# Patient Record
Sex: Female | Born: 1960 | Race: Black or African American | Hispanic: No | Smoking: Former smoker
Health system: Southern US, Community
[De-identification: ages and names within clinical notes are randomized; demographics above are authoritative.]

## PROBLEM LIST (undated history)

## (undated) DIAGNOSIS — I10 Essential (primary) hypertension: Secondary | ICD-10-CM

## (undated) DIAGNOSIS — I509 Heart failure, unspecified: Secondary | ICD-10-CM

## (undated) DIAGNOSIS — E785 Hyperlipidemia, unspecified: Secondary | ICD-10-CM

## (undated) DIAGNOSIS — E119 Type 2 diabetes mellitus without complications: Secondary | ICD-10-CM

---

## 2008-11-05 ENCOUNTER — Emergency Department (HOSPITAL_BASED_OUTPATIENT_CLINIC_OR_DEPARTMENT_OTHER): Admission: EM | Admit: 2008-11-05 | Discharge: 2008-11-05 | Payer: Self-pay | Admitting: Emergency Medicine

## 2008-11-05 ENCOUNTER — Ambulatory Visit: Payer: Self-pay | Admitting: Interventional Radiology

## 2010-04-17 ENCOUNTER — Ambulatory Visit: Payer: Self-pay | Admitting: Diagnostic Radiology

## 2010-04-17 ENCOUNTER — Ambulatory Visit (HOSPITAL_BASED_OUTPATIENT_CLINIC_OR_DEPARTMENT_OTHER)
Admission: RE | Admit: 2010-04-17 | Discharge: 2010-04-17 | Payer: Self-pay | Source: Home / Self Care | Admitting: Obstetrics and Gynecology

## 2010-08-26 LAB — CBC
HCT: 46.9 % — ABNORMAL HIGH (ref 36.0–46.0)
MCV: 91.5 fL (ref 78.0–100.0)
Platelets: 275 10*3/uL (ref 150–400)
RDW: 12.6 % (ref 11.5–15.5)
WBC: 6.4 10*3/uL (ref 4.0–10.5)

## 2010-08-26 LAB — BASIC METABOLIC PANEL
BUN: 9 mg/dL (ref 6–23)
Chloride: 102 mEq/L (ref 96–112)
Creatinine, Ser: 0.6 mg/dL (ref 0.4–1.2)
Glucose, Bld: 202 mg/dL — ABNORMAL HIGH (ref 70–99)
Potassium: 4.2 mEq/L (ref 3.5–5.1)

## 2010-08-26 LAB — DIFFERENTIAL
Basophils Absolute: 0.1 10*3/uL (ref 0.0–0.1)
Basophils Relative: 1 % (ref 0–1)
Eosinophils Absolute: 0.2 10*3/uL (ref 0.0–0.7)
Eosinophils Relative: 2 % (ref 0–5)
Lymphs Abs: 2.6 10*3/uL (ref 0.7–4.0)
Neutrophils Relative %: 48 % (ref 43–77)

## 2010-08-26 LAB — POCT CARDIAC MARKERS: Myoglobin, poc: 73 ng/mL (ref 12–200)

## 2015-12-21 HISTORY — PX: AORTA - FEMORAL ARTERY BYPASS GRAFT: SUR173

## 2016-08-04 ENCOUNTER — Emergency Department (HOSPITAL_BASED_OUTPATIENT_CLINIC_OR_DEPARTMENT_OTHER)
Admission: EM | Admit: 2016-08-04 | Discharge: 2016-08-05 | Disposition: A | Discharge status: Home / Self Care | Payer: PRIVATE HEALTH INSURANCE | Attending: Emergency Medicine | Admitting: Emergency Medicine

## 2016-08-04 ENCOUNTER — Emergency Department (HOSPITAL_BASED_OUTPATIENT_CLINIC_OR_DEPARTMENT_OTHER): Payer: PRIVATE HEALTH INSURANCE

## 2016-08-04 ENCOUNTER — Encounter (HOSPITAL_BASED_OUTPATIENT_CLINIC_OR_DEPARTMENT_OTHER): Payer: Self-pay | Admitting: *Deleted

## 2016-08-04 DIAGNOSIS — F17200 Nicotine dependence, unspecified, uncomplicated: Secondary | ICD-10-CM | POA: Diagnosis not present

## 2016-08-04 DIAGNOSIS — E119 Type 2 diabetes mellitus without complications: Secondary | ICD-10-CM | POA: Insufficient documentation

## 2016-08-04 DIAGNOSIS — I10 Essential (primary) hypertension: Secondary | ICD-10-CM | POA: Insufficient documentation

## 2016-08-04 DIAGNOSIS — H538 Other visual disturbances: Secondary | ICD-10-CM | POA: Diagnosis present

## 2016-08-04 HISTORY — DX: Hyperlipidemia, unspecified: E78.5

## 2016-08-04 HISTORY — DX: Type 2 diabetes mellitus without complications: E11.9

## 2016-08-04 HISTORY — DX: Heart failure, unspecified: I50.9

## 2016-08-04 HISTORY — DX: Essential (primary) hypertension: I10

## 2016-08-04 LAB — COMPREHENSIVE METABOLIC PANEL
ALT: 27 U/L (ref 14–54)
ANION GAP: 9 (ref 5–15)
AST: 27 U/L (ref 15–41)
Albumin: 4.5 g/dL (ref 3.5–5.0)
Alkaline Phosphatase: 72 U/L (ref 38–126)
BUN: 13 mg/dL (ref 6–20)
CHLORIDE: 98 mmol/L — AB (ref 101–111)
CO2: 31 mmol/L (ref 22–32)
CREATININE: 0.63 mg/dL (ref 0.44–1.00)
Calcium: 9.7 mg/dL (ref 8.9–10.3)
Glucose, Bld: 143 mg/dL — ABNORMAL HIGH (ref 65–99)
POTASSIUM: 4.3 mmol/L (ref 3.5–5.1)
Sodium: 138 mmol/L (ref 135–145)
Total Bilirubin: 0.5 mg/dL (ref 0.3–1.2)
Total Protein: 8.1 g/dL (ref 6.5–8.1)

## 2016-08-04 LAB — CBC WITH DIFFERENTIAL/PLATELET
Basophils Absolute: 0 10*3/uL (ref 0.0–0.1)
Basophils Relative: 1 %
EOS ABS: 0.1 10*3/uL (ref 0.0–0.7)
EOS PCT: 2 %
HCT: 54.9 % — ABNORMAL HIGH (ref 36.0–46.0)
Hemoglobin: 17.9 g/dL — ABNORMAL HIGH (ref 12.0–15.0)
LYMPHS ABS: 2.2 10*3/uL (ref 0.7–4.0)
LYMPHS PCT: 49 %
MCH: 30.1 pg (ref 26.0–34.0)
MCHC: 32.6 g/dL (ref 30.0–36.0)
MCV: 92.3 fL (ref 78.0–100.0)
MONO ABS: 0.4 10*3/uL (ref 0.1–1.0)
MONOS PCT: 9 %
Neutro Abs: 1.8 10*3/uL (ref 1.7–7.7)
Neutrophils Relative %: 39 %
PLATELETS: 221 10*3/uL (ref 150–400)
RBC: 5.95 MIL/uL — ABNORMAL HIGH (ref 3.87–5.11)
RDW: 12.3 % (ref 11.5–15.5)
WBC: 4.5 10*3/uL (ref 4.0–10.5)

## 2016-08-04 LAB — CBG MONITORING, ED: GLUCOSE-CAPILLARY: 160 mg/dL — AB (ref 65–99)

## 2016-08-04 LAB — BRAIN NATRIURETIC PEPTIDE: B NATRIURETIC PEPTIDE 5: 37.3 pg/mL (ref 0.0–100.0)

## 2016-08-04 NOTE — ED Triage Notes (Signed)
Here for "felt bad, felt woozy", also anxious, "onset at ~ 1900 while sitting, and preparing for an interview" (denies: HA, dizziness, visual changes, nausea, sob, or CP), "thought it was her BP or BS". Took lisinopril 5mg  at 1930, last dose prior to that was 3 weeks ago. Takes PRN. Has a PCP and card MD. Alert, NAD, calm, interactive, resps e/u, speaking in clear complete sentences, no dyspnea noted, skin W&D. [facility]

## 2016-08-04 NOTE — ED Notes (Signed)
Patient is on the phone with an radio interview. Patient on hold for CT at this time and MD aware

## 2016-08-04 NOTE — ED Notes (Signed)
ED Provider at bedside. 

## 2016-08-04 NOTE — ED Provider Notes (Signed)
[facility] Provider [facility]   CSN: [id number] Arrival date & time: 08/04/16  1952   By signing my name below, I, Kaitlyn Weber, attest that this documentation has been prepared under the direction and in the presence of Kaitlyn FossaElizabeth Ahkeem Goede, MD . Electronically Signed: Freida Busmaniana Weber, Scribe. 08/04/2016. 9:31 PM.   History   Chief Complaint Chief Complaint  Patient presents with  . Hypertension    The history is provided by the patient. No language interpreter was used.     HPI Comments:  Kaitlyn Weber is a 56 y.o. female with a history of heart failure, DM, HLD, and HTN, who presents to the Emergency Department complaining of elevated BP today. She was preparing for a radio interview when she began to feel woozy and off balance. She checked her BP and it was 199/126 around 1830. She checked it again and it was 229/136. She then took a lisinopril. She states she usually takes the lisinopril when her BP is elevated and not as prescribed. Pt states she feels better at this time. She has no other acute complaints or symptoms at this time.    Past Medical History:  Diagnosis Date  . Diabetes mellitus without complication (HCC)   . Heart failure (HCC)   . Hyperlipidemia   . Hypertension     There are no active problems to display for this patient.   Past Surgical History:  Procedure Laterality Date  . AORTA - FEMORAL ARTERY BYPASS GRAFT [facility]21/2015   [facility] [place] [place]  . CESAREAN SECTION      OB History    No data available       Home Medications    Prior to Admission medications   Not on File    Family History History reviewed. No pertinent family history.  Social History Social History  Substance Use Topics  . Smoking status: Current Some Day Smoker  . Smokeless tobacco: Never Used  . Alcohol use No     Allergies   Sulfa antibiotics   Review of Systems Review of Systems  10 systems reviewed and all are negative for acute change except  as noted in the HPI.   Physical Exam Updated Vital Signs BP (!) 120/92   Pulse 99   Temp 98.1 F (36.7 C) (Oral)   Resp 12   Ht 5' 4.5" (1.638 m)   Wt 140 lb (63.5 kg)   SpO2 98%   BMI 23.66 kg/m   Physical Exam  Constitutional: She is oriented to person, place, and time. She appears well-developed and well-nourished.  HENT:  Head: Normocephalic and atraumatic.  Neck: Neck supple.  Cardiovascular: Normal rate and regular rhythm.   No murmur heard. Pulmonary/Chest: Effort normal and breath sounds normal. No respiratory distress.  Abdominal: Soft. There is no tenderness. There is no rebound and no guarding.  Musculoskeletal: She exhibits no edema or tenderness.  Neurological: She is alert and oriented to person, place, and time.  No facial asymmetry.  5/5 strength in all four extremities.   Skin: Skin is warm and dry.  Psychiatric: She has a normal mood and affect. Her behavior is normal.  Nursing note and vitals reviewed.    ED Treatments / Results  DIAGNOSTIC STUDIES:  Oxygen Saturation is 97% on RA, normal by my interpretation.    COORDINATION OF CARE:  9:30 PM Discussed treatment plan with pt at bedside and pt agreed to plan.  Labs (all labs ordered are listed, but only abnormal results are  displayed) Labs Reviewed  CBC WITH DIFFERENTIAL/PLATELET - Abnormal; Notable for the following:       Result Value   RBC 5.95 (*)    Hemoglobin 17.9 (*)    HCT 54.9 (*)    All other components within normal limits  COMPREHENSIVE METABOLIC PANEL - Abnormal; Notable for the following:    Chloride 98 (*)    Glucose, Bld 143 (*)    All other components within normal limits  CBG MONITORING, ED - Abnormal; Notable for the following:    Glucose-Capillary 160 (*)    All other components within normal limits  BRAIN NATRIURETIC PEPTIDE    EKG  EKG Interpretation  Date/Time:  Monday August 04 2016 20:18:13 EDT Ventricular Rate:  99 PR Interval:  114 QRS Duration: 84 QT  Interval:  360 QTC Calculation: 462 R Axis:   145 Text Interpretation:   Suspect arm lead reversal, interpretation assumes no reversal Normal sinus rhythm Right axis deviation Pulmonary disease pattern Abnormal ECG Confirmed by Lincoln Brigham [phone]) on 08/04/2016 8:34:22 PM       Radiology Dg Chest 2 View  Result Date: 08/04/2016 CLINICAL DATA:  Malignant hypertension EXAM: CHEST  2 VIEW COMPARISON:  11/05/2008 FINDINGS: Streaky atelectasis at the left lung base. No pleural effusion. Cardiomediastinal silhouette within normal limits. No pneumothorax. IMPRESSION: Streaky left basilar atelectasis. Electronically Signed   By: Kaitlyn Weber M.D.   On: 08/04/2016 21:15   Ct Head Wo Contrast  Result Date: 08/04/2016 CLINICAL DATA:  Acute onset of blurred vision.  Initial encounter. EXAM: CT HEAD WITHOUT CONTRAST TECHNIQUE: Contiguous axial images were obtained from the base of the skull through the vertex without intravenous contrast. COMPARISON:  None. FINDINGS: Brain: No evidence of acute infarction, hemorrhage, hydrocephalus, extra-axial collection or mass lesion/mass effect. The posterior fossa, including the cerebellum, brainstem and fourth ventricle, is within normal limits. The third and lateral ventricles, and basal ganglia are unremarkable in appearance. The cerebral hemispheres are symmetric in appearance, with normal gray-white differentiation. No mass effect or midline shift is seen. Vascular: No hyperdense vessel or unexpected calcification. Skull: There is no evidence of fracture; visualized osseous structures are unremarkable in appearance. Sinuses/Orbits: The visualized portions of the orbits are within normal limits. The paranasal sinuses and mastoid air cells are well-aerated. Other: No significant soft tissue abnormalities are seen. IMPRESSION: Unremarkable noncontrast CT of the head. Electronically Signed   By: Roanna Raider M.D.   On: 08/04/2016 23:50    Procedures Procedures (including  critical care time)  Medications Ordered in ED Medications - No data to display   Initial Impression / Assessment and Plan / ED Course  I have reviewed the triage vital signs and the nursing notes.  Pertinent labs & imaging results that were available during my care of the patient were reviewed by me and considered in my medical decision making (see chart for details).     Patient with history of hypertension and vascular disease here for evaluation of a woozy sensation with elevated blood pressure prior to ED arrival. Patient is hypertensive on ED arrival but this sensation of wooziness has resolved. She hasn't noticed acute distress with a nonfocal neurologic examination. Presentation is not consistent with CVA, hypertensive emergency, ACS, dissection. On repeat evaluation she continues to be asymptomatic and her blood pressure is 120/92. Counseled patient on home care for hypertension as well as taking her lisinopril daily with close PCP follow-up and return precautions.  Final Clinical Impressions(s) / ED Diagnoses  Final diagnoses:  Essential hypertension    New Prescriptions New Prescriptions   No medications on file   I personally performed the services described in this documentation, which was scribed in my presence. The recorded information has been reviewed and is accurate.     Kaitlyn Fossa, MD 08/05/16 0010

## 2017-07-05 ENCOUNTER — Emergency Department (HOSPITAL_BASED_OUTPATIENT_CLINIC_OR_DEPARTMENT_OTHER): Payer: Medicare Other

## 2017-07-05 ENCOUNTER — Emergency Department (HOSPITAL_BASED_OUTPATIENT_CLINIC_OR_DEPARTMENT_OTHER)
Admission: EM | Admit: 2017-07-05 | Discharge: 2017-07-05 | Disposition: A | Discharge status: Home / Self Care | Payer: Medicare Other | Attending: Emergency Medicine | Admitting: Emergency Medicine

## 2017-07-05 ENCOUNTER — Other Ambulatory Visit: Payer: Self-pay

## 2017-07-05 ENCOUNTER — Encounter (HOSPITAL_BASED_OUTPATIENT_CLINIC_OR_DEPARTMENT_OTHER): Payer: Self-pay | Admitting: Emergency Medicine

## 2017-07-05 DIAGNOSIS — I10 Essential (primary) hypertension: Secondary | ICD-10-CM | POA: Diagnosis not present

## 2017-07-05 DIAGNOSIS — J441 Chronic obstructive pulmonary disease with (acute) exacerbation: Secondary | ICD-10-CM | POA: Insufficient documentation

## 2017-07-05 DIAGNOSIS — Z79899 Other long term (current) drug therapy: Secondary | ICD-10-CM | POA: Diagnosis not present

## 2017-07-05 DIAGNOSIS — E119 Type 2 diabetes mellitus without complications: Secondary | ICD-10-CM | POA: Diagnosis not present

## 2017-07-05 DIAGNOSIS — F17200 Nicotine dependence, unspecified, uncomplicated: Secondary | ICD-10-CM | POA: Diagnosis not present

## 2017-07-05 DIAGNOSIS — Z7984 Long term (current) use of oral hypoglycemic drugs: Secondary | ICD-10-CM | POA: Insufficient documentation

## 2017-07-05 DIAGNOSIS — R0602 Shortness of breath: Secondary | ICD-10-CM | POA: Diagnosis present

## 2017-07-05 LAB — I-STAT CG4 LACTIC ACID, ED: Lactic Acid, Venous: 1.88 mmol/L (ref 0.5–1.9)

## 2017-07-05 LAB — COMPREHENSIVE METABOLIC PANEL
ALK PHOS: 47 U/L (ref 38–126)
ALT: 22 U/L (ref 14–54)
ANION GAP: 5 (ref 5–15)
AST: 26 U/L (ref 15–41)
Albumin: 3.6 g/dL (ref 3.5–5.0)
BUN: 12 mg/dL (ref 6–20)
CALCIUM: 8.6 mg/dL — AB (ref 8.9–10.3)
CO2: 32 mmol/L (ref 22–32)
Chloride: 97 mmol/L — ABNORMAL LOW (ref 101–111)
Creatinine, Ser: 0.67 mg/dL (ref 0.44–1.00)
GFR calc non Af Amer: >60 mL/min (ref >60)
Glucose, Bld: 212 mg/dL — ABNORMAL HIGH (ref 65–99)
POTASSIUM: 4.5 mmol/L (ref 3.5–5.1)
Sodium: 134 mmol/L — ABNORMAL LOW (ref 135–145)
TOTAL PROTEIN: 6.8 g/dL (ref 6.5–8.1)
Total Bilirubin: 0.4 mg/dL (ref 0.3–1.2)

## 2017-07-05 LAB — CBC WITH DIFFERENTIAL/PLATELET
BASOS ABS: 0 10*3/uL (ref 0.0–0.1)
Basophils Relative: 1 %
Eosinophils Absolute: 0 10*3/uL (ref 0.0–0.7)
Eosinophils Relative: 1 %
HEMATOCRIT: 52.3 % — AB (ref 36.0–46.0)
HEMOGLOBIN: 16.5 g/dL — AB (ref 12.0–15.0)
LYMPHS PCT: 23 %
Lymphs Abs: 1.4 10*3/uL (ref 0.7–4.0)
MCH: 30.3 pg (ref 26.0–34.0)
MCHC: 31.5 g/dL (ref 30.0–36.0)
MCV: 96.1 fL (ref 78.0–100.0)
MONO ABS: 0.8 10*3/uL (ref 0.1–1.0)
MONOS PCT: 14 %
NEUTROS ABS: 3.7 10*3/uL (ref 1.7–7.7)
NEUTROS PCT: 63 %
Platelets: 191 10*3/uL (ref 150–400)
RBC: 5.44 MIL/uL — ABNORMAL HIGH (ref 3.87–5.11)
RDW: 12.9 % (ref 11.5–15.5)
WBC: 6 10*3/uL (ref 4.0–10.5)

## 2017-07-05 LAB — INFLUENZA PANEL BY PCR (TYPE A & B)
INFLAPCR: NEGATIVE
INFLBPCR: NEGATIVE

## 2017-07-05 LAB — TROPONIN I: Troponin I: <0.03 ng/mL (ref <0.03)

## 2017-07-05 MED ORDER — AZITHROMYCIN 250 MG PO TABS: 250.0000 mg | ORAL_TABLET | Freq: Every day | ORAL | 0 refills | Status: AC

## 2017-07-05 MED ORDER — ALBUTEROL SULFATE (2.5 MG/3ML) 0.083% IN NEBU
5.0000 mg | INHALATION_SOLUTION | Freq: Once | RESPIRATORY_TRACT | Status: AC
  Administered 2017-07-05: 5 mg via RESPIRATORY_TRACT
  Filled 2017-07-05: qty 6

## 2017-07-05 MED ORDER — PREDNISONE 20 MG PO TABS: ORAL_TABLET | ORAL | 0 refills | Status: DC

## 2017-07-05 MED ORDER — ALBUTEROL SULFATE HFA 108 (90 BASE) MCG/ACT IN AERS
2.0000 | INHALATION_SPRAY | Freq: Once | RESPIRATORY_TRACT | Status: AC
  Administered 2017-07-05: 2 via RESPIRATORY_TRACT
  Filled 2017-07-05: qty 6.7

## 2017-07-05 MED ORDER — PREDNISONE 50 MG PO TABS
60.0000 mg | ORAL_TABLET | Freq: Once | ORAL | Status: AC
  Administered 2017-07-05: 18:00:00 60 mg via ORAL
  Filled 2017-07-05: qty 1

## 2017-07-05 MED ORDER — ALBUTEROL SULFATE (2.5 MG/3ML) 0.083% IN NEBU
5.0000 mg | INHALATION_SOLUTION | Freq: Once | RESPIRATORY_TRACT | Status: AC
  Administered 2017-07-05: 2.5 mg via RESPIRATORY_TRACT
  Filled 2017-07-05: qty 6

## 2017-07-05 MED ORDER — IPRATROPIUM-ALBUTEROL 0.5-2.5 (3) MG/3ML IN SOLN
3.0000 mL | Freq: Four times a day (QID) | RESPIRATORY_TRACT | Status: DC
  Administered 2017-07-05: 3 mL via RESPIRATORY_TRACT

## 2017-07-05 MED ORDER — IPRATROPIUM BROMIDE 0.02 % IN SOLN
0.5000 mg | Freq: Once | RESPIRATORY_TRACT | Status: DC
  Filled 2017-07-05: qty 2.5

## 2017-07-05 MED ORDER — ACETAMINOPHEN 325 MG PO TABS
650.0000 mg | ORAL_TABLET | Freq: Once | ORAL | Status: AC
  Administered 2017-07-05: 650 mg via ORAL
  Filled 2017-07-05: qty 2

## 2017-07-05 NOTE — ED Notes (Signed)
Patient was showing 88-90 percent oxygen while ambulating. Walked patient back to the room and put back on oxygen. Nasal Cannula. 2LPM

## 2017-07-05 NOTE — ED Provider Notes (Signed)
[facility] EMERGENCY DEPARTMENT Provider Note   CSN: [id number] Arrival date & time: 07/05/17  1409     History   Chief Complaint Chief Complaint  Patient presents with  . Cough    HPI Kaitlyn Weber is a 57 y.o. female.  HPI  57 year old female with a history of diabetes, hypertension, CHF and chronic bronchitis presents with shortness of breath.  She states she has been feeling ill for about 3 days.  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  She has had low-grade fevers up to around 99.  She has been coughing with a mixture of white and yellow sputum.  She has had nasal congestion, chest congestion, and shortness of breath.  She denies any leg swelling.  Has had some headache and myalgias.  Some chest pain with coughing but not all the time.  Past Medical History:  Diagnosis Date  . Diabetes mellitus without complication (HCC)   . Heart failure (HCC)   . Hyperlipidemia   . Hypertension     There are no active problems to display for this patient.   Past Surgical History:  Procedure Laterality Date  . AORTA - FEMORAL ARTERY BYPASS GRAFT Bilateral 12/21/2015   at [facility], [place]  . CESAREAN SECTION      OB History    No data available       Home Medications    Prior to Admission medications   Medication Sig Start Date End Date Taking? Authorizing Provider  furosemide (LASIX) 20 MG tablet Take 20 mg by mouth.   Yes [provider]  lisinopril (PRINIVIL,ZESTRIL) 20 MG tablet Take 20 mg by mouth daily.   Yes [provider]  azithromycin (ZITHROMAX) 250 MG tablet Take 1 tablet (250 mg total) by mouth daily. Take first 2 tablets together, then 1 every day until finished. 07/05/17   Pricilla Loveless, MD  metFORMIN (GLUCOPHAGE) 500 MG tablet Take by mouth 2 (two) times daily with a meal.    [provider]  predniSONE (DELTASONE) 20 MG tablet 2  tabs po daily x 4 days 07/06/17   Pricilla Loveless, MD    Family History History reviewed. No pertinent family history.  Social History Social History   Tobacco Use  . Smoking status: Current Some Day Smoker  . Smokeless tobacco: Never Used  Substance Use Topics  . Alcohol use: No  . Drug use: No     Allergies   Sulfa antibiotics   Review of Systems Review of Systems  Constitutional: Positive for fever.  HENT: Positive for congestion.   Respiratory: Positive for cough and shortness of breath.   Cardiovascular: Positive for chest pain. Negative for leg swelling.  Gastrointestinal: Negative for vomiting.  Musculoskeletal: Positive for myalgias.  Neurological: Positive for headaches.  All other systems reviewed and are negative.    Physical Exam Updated Vital Signs BP 121/73   Pulse 88   Temp (!) 100.8 F (38.2 C) (Oral)   Resp 19   Ht 5\' 5"  (1.651 m)   Wt 68 kg (150 lb)   SpO2 90% Comment: While walking. Patient did not seem to be in distress at this time.  BMI 24.96 kg/m   Physical Exam  Constitutional: She is oriented to person, place, and time. She appears well-developed and well-nourished. No distress.  HENT:  Head: Normocephalic and atraumatic.  Right Ear: External ear normal.  Left Ear: External ear normal.  Nose: Nose normal.  Eyes: Right eye exhibits no discharge. Left eye exhibits no discharge.  Cardiovascular: Normal rate, regular rhythm and normal heart sounds.  Pulmonary/Chest: Effort normal. No respiratory distress. She has wheezes (diffuse expiratory).  Abdominal: Soft. There is no tenderness.  Neurological: She is alert and oriented to person, place, and time.  Skin: Skin is warm and dry. She is not diaphoretic.  Nursing note and vitals reviewed.    ED Treatments / Results  Labs (all labs ordered are listed, but only abnormal results are displayed) Labs Reviewed  COMPREHENSIVE METABOLIC PANEL - Abnormal; Notable for the following  components:      Result Value   Sodium 134 (*)    Chloride 97 (*)    Glucose, Bld 212 (*)    Calcium 8.6 (*)    All other components within normal limits  CBC WITH DIFFERENTIAL/PLATELET - Abnormal; Notable for the following components:   RBC 5.44 (*)    Hemoglobin 16.5 (*)    HCT 52.3 (*)    All other components within normal limits  CULTURE, BLOOD (ROUTINE X 2)  CULTURE, BLOOD (ROUTINE X 2)  TROPONIN I  INFLUENZA PANEL BY PCR (TYPE A & B)  I-STAT CG4 LACTIC ACID, ED    EKG  EKG Interpretation  Date/Time:  Sunday July 05 2017 15:02:43 EST Ventricular Rate:  96 PR Interval:  136 QRS Duration: 70 QT Interval:  336 QTC Calculation: 424 R Axis:   121 Text Interpretation:  Normal sinus rhythm Right axis deviation Anterior infarct , age undetermined Abnormal ECG no significant change since Mar 2018 Confirmed by Pricilla Loveless [phone]) on 07/05/2017 4:03:56 PM       Radiology Dg Chest 2 View  Result Date: 07/05/2017 CLINICAL DATA:  Cough EXAM: CHEST  2 VIEW COMPARISON:  11/20/2016 chest radiograph. FINDINGS: Stable cardiomediastinal silhouette with normal heart size. No pneumothorax. No pleural effusion. Lungs appear clear, with no acute consolidative airspace disease and no pulmonary edema. IMPRESSION: No active cardiopulmonary disease. Electronically Signed   By: Delbert Phenix M.D.   On: 07/05/2017 16:48    Procedures Procedures (including critical care time)  Medications Ordered in ED Medications  ipratropium-albuterol (DUONEB) 0.5-2.5 (3) MG/3ML nebulizer solution 3 mL (3 mLs Nebulization Given 07/05/17 1629)  predniSONE (DELTASONE) tablet 60 mg (not administered)  albuterol (PROVENTIL HFA;VENTOLIN HFA) 108 (90 Base) MCG/ACT inhaler 2 puff (not administered)  albuterol (PROVENTIL) (2.5 MG/3ML) 0.083% nebulizer solution 5 mg (5 mg Nebulization Given 07/05/17 1513)  albuterol (PROVENTIL) (2.5 MG/3ML) 0.083% nebulizer solution 5 mg (2.5 mg Nebulization Given 07/05/17 1628)    acetaminophen (TYLENOL) tablet 650 mg (650 mg Oral Given 07/05/17 1626)     Initial Impression / Assessment and Plan / ED Course  I have reviewed the triage vital signs and the nursing notes.  Pertinent labs & imaging results that were available during my care of the patient were reviewed by me and considered in my medical decision making (see chart for details).     Presentation most consistent with COPD exacerbation.  I have sent an influenza swab given her symptoms but I am not certain this is obviously influenza.  Thus we will hold off on Tamiflu until swab comes back.  However this will be multiple hours at this facility.  She will be discharged home with treatment for COPD with albuterol inhaler and steroid burst.  She otherwise feels well and wants to go home.  We discussed that with  her hypoxia she is at risk for organ injury/failure and other acute events.  She understands.  When walking she did not feel increased shortness of breath and her sats did not go lower than 88%.  Why discussed this is still somewhat low even for COPD, she understands and still wants to go home.  We discussed strict return precautions.  She knows to return if any symptoms were to worsen.  Given this is consistent with COPD exacerbation with shortness of breath and wheezing and fever, she will be treated with azithromycin as well.  Final Clinical Impressions(s) / ED Diagnoses   Final diagnoses:  COPD with acute exacerbation [facility](HCC)    ED Discharge Orders        Ordered    predniSONE (DELTASONE) 20 MG tablet     07/05/17 1805    azithromycin (ZITHROMAX) 250 MG tablet  Daily     07/05/17 1806       Pricilla LovelessGoldston, Hoang Reich, MD 07/05/17 1809

## 2017-07-05 NOTE — Discharge Instructions (Signed)
Use your albuterol inhaler 2 puffs every 4 hours for the next 48 hours.  If you are needing more than this or your symptoms worsen or do not improve, return to the ER or see your doctor immediately.

## 2017-07-05 NOTE — ED Triage Notes (Addendum)
Patient states that she has been sick since Thursday - Patient states that she is having decreased BP and O2 sats at home. Patient reports that she is having a cough, yellow sputum and Chest congestion  - has tightness in her chest  - patient has O2 sats of 91% when she is sitting still - 79% after exertion.

## 2017-07-10 LAB — CULTURE, BLOOD (ROUTINE X 2)
Culture: NO GROWTH
Culture: NO GROWTH
SPECIAL REQUESTS: ADEQUATE

## 2017-08-31 ENCOUNTER — Emergency Department (HOSPITAL_BASED_OUTPATIENT_CLINIC_OR_DEPARTMENT_OTHER)
Admission: EM | Admit: 2017-08-31 | Discharge: 2017-08-31 | Disposition: A | Discharge status: Home / Self Care | Payer: Medicare Other | Source: Home / Self Care | Attending: Emergency Medicine | Admitting: Emergency Medicine

## 2017-08-31 ENCOUNTER — Encounter (HOSPITAL_BASED_OUTPATIENT_CLINIC_OR_DEPARTMENT_OTHER): Payer: Self-pay | Admitting: *Deleted

## 2017-08-31 ENCOUNTER — Encounter (HOSPITAL_BASED_OUTPATIENT_CLINIC_OR_DEPARTMENT_OTHER): Payer: Self-pay | Admitting: Emergency Medicine

## 2017-08-31 ENCOUNTER — Emergency Department (HOSPITAL_BASED_OUTPATIENT_CLINIC_OR_DEPARTMENT_OTHER)
Admission: EM | Admit: 2017-08-31 | Discharge: 2017-08-31 | Discharge status: Against Medical Advice | Payer: Medicare Other | Attending: Emergency Medicine | Admitting: Emergency Medicine

## 2017-08-31 ENCOUNTER — Other Ambulatory Visit: Payer: Self-pay

## 2017-08-31 ENCOUNTER — Emergency Department (HOSPITAL_BASED_OUTPATIENT_CLINIC_OR_DEPARTMENT_OTHER): Payer: Medicare Other

## 2017-08-31 DIAGNOSIS — Z87891 Personal history of nicotine dependence: Secondary | ICD-10-CM | POA: Diagnosis not present

## 2017-08-31 DIAGNOSIS — R0902 Hypoxemia: Secondary | ICD-10-CM

## 2017-08-31 DIAGNOSIS — I110 Hypertensive heart disease with heart failure: Secondary | ICD-10-CM | POA: Insufficient documentation

## 2017-08-31 DIAGNOSIS — Z79899 Other long term (current) drug therapy: Secondary | ICD-10-CM | POA: Insufficient documentation

## 2017-08-31 DIAGNOSIS — Z7984 Long term (current) use of oral hypoglycemic drugs: Secondary | ICD-10-CM

## 2017-08-31 DIAGNOSIS — I509 Heart failure, unspecified: Secondary | ICD-10-CM | POA: Insufficient documentation

## 2017-08-31 DIAGNOSIS — J441 Chronic obstructive pulmonary disease with (acute) exacerbation: Secondary | ICD-10-CM

## 2017-08-31 DIAGNOSIS — E119 Type 2 diabetes mellitus without complications: Secondary | ICD-10-CM | POA: Insufficient documentation

## 2017-08-31 DIAGNOSIS — R0602 Shortness of breath: Secondary | ICD-10-CM | POA: Diagnosis present

## 2017-08-31 HISTORY — DX: Heart failure, unspecified: I50.9

## 2017-08-31 LAB — COMPREHENSIVE METABOLIC PANEL
ALBUMIN: 3.5 g/dL (ref 3.5–5.0)
ALK PHOS: 47 U/L (ref 38–126)
ALT: 47 U/L (ref 14–54)
AST: 32 U/L (ref 15–41)
Anion gap: 10 (ref 5–15)
BILIRUBIN TOTAL: 0.6 mg/dL (ref 0.3–1.2)
BUN: 16 mg/dL (ref 6–20)
CO2: 30 mmol/L (ref 22–32)
CREATININE: 0.78 mg/dL (ref 0.44–1.00)
Calcium: 8.5 mg/dL — ABNORMAL LOW (ref 8.9–10.3)
Chloride: 92 mmol/L — ABNORMAL LOW (ref 101–111)
GFR calc Af Amer: >60 mL/min (ref >60)
GFR calc non Af Amer: >60 mL/min (ref >60)
GLUCOSE: 210 mg/dL — AB (ref 65–99)
Potassium: 4.9 mmol/L (ref 3.5–5.1)
Sodium: 132 mmol/L — ABNORMAL LOW (ref 135–145)
Total Protein: 6.3 g/dL — ABNORMAL LOW (ref 6.5–8.1)

## 2017-08-31 LAB — CBC WITH DIFFERENTIAL/PLATELET
Basophils Absolute: 0 10*3/uL (ref 0.0–0.1)
Basophils Relative: 1 %
EOS PCT: 0 %
Eosinophils Absolute: 0 10*3/uL (ref 0.0–0.7)
HEMATOCRIT: 56 % — AB (ref 36.0–46.0)
HEMOGLOBIN: 17.8 g/dL — AB (ref 12.0–15.0)
LYMPHS ABS: 1.2 10*3/uL (ref 0.7–4.0)
LYMPHS PCT: 32 %
MCH: 30.1 pg (ref 26.0–34.0)
MCHC: 31.8 g/dL (ref 30.0–36.0)
MCV: 94.8 fL (ref 78.0–100.0)
Monocytes Absolute: 0.7 10*3/uL (ref 0.1–1.0)
Monocytes Relative: 20 %
NEUTROS ABS: 1.7 10*3/uL (ref 1.7–7.7)
Neutrophils Relative %: 47 %
Platelets: 189 10*3/uL (ref 150–400)
RBC: 5.91 MIL/uL — AB (ref 3.87–5.11)
RDW: 13.9 % (ref 11.5–15.5)
WBC: 3.7 10*3/uL — AB (ref 4.0–10.5)

## 2017-08-31 LAB — BRAIN NATRIURETIC PEPTIDE: B Natriuretic Peptide: 239.7 pg/mL — ABNORMAL HIGH (ref 0.0–100.0)

## 2017-08-31 MED ORDER — PREDNISONE 20 MG PO TABS: 20.0000 mg | ORAL_TABLET | Freq: Two times a day (BID) | ORAL | 0 refills | Status: AC

## 2017-08-31 MED ORDER — METHYLPREDNISOLONE SODIUM SUCC 125 MG IJ SOLR
125.0000 mg | Freq: Once | INTRAMUSCULAR | Status: AC
  Administered 2017-08-31: 125 mg via INTRAVENOUS
  Filled 2017-08-31: qty 2

## 2017-08-31 MED ORDER — PREDNISONE 20 MG PO TABS: 20.0000 mg | ORAL_TABLET | Freq: Two times a day (BID) | ORAL | 0 refills | Status: DC

## 2017-08-31 MED ORDER — ALBUTEROL (5 MG/ML) CONTINUOUS INHALATION SOLN
10.0000 mg/h | INHALATION_SOLUTION | Freq: Once | RESPIRATORY_TRACT | Status: AC
  Administered 2017-08-31: 10 mg/h via RESPIRATORY_TRACT
  Filled 2017-08-31: qty 20

## 2017-08-31 MED FILL — predniSONE 20 MG TABS: 20 | 5 days supply | Qty: 10 | Fill #0

## 2017-08-31 NOTE — ED Provider Notes (Signed)
Patient visit shared. Patient with history of COPD, used to be on home oxygen but has been off of it for the last 3 to 4 months here for evaluation of shortness of breath. She does have a new oxygen requirement of 2 to 3 L. She has been refusing admission to the hospital and wants to go home but has persistent recurrent hypoxia. [facility] for [facility]. Discussed with the on-call physician who did not accept the patient and transfer. [facility] with [facility] regarding availability of [facility] oxygen. They state that they are unable to arrange home oxygen from the emergency department and she will require admission or prescription from her pulmonologist. Attempted to contact her pulmonologist but unable to reach them. Patient refused transfer or admission to any other facility. She left the department against medical advice.   Tilden Fossaees, Joab Carden, MD 08/31/17 2326

## 2017-08-31 NOTE — ED Notes (Signed)
Oxygen delivered to pt by [facility].

## 2017-08-31 NOTE — ED Notes (Signed)
When to ask patent what she would like to eat, per the pt nothing we have [facility] she could eat. She had to have low or no sodium and sugar free, no starch.  Patent said I just want to go home. I asked if she ment AMA patent said yes. I said I'll have to get the RN. I notified patents RN and the Consulting civil engineerCharge RN.

## 2017-08-31 NOTE — ED Notes (Signed)
ED Provider at bedside. 

## 2017-08-31 NOTE — ED Notes (Signed)
Discussion about possible admission or discharge. Education being given to the patient. Saturations 86% on room air.

## 2017-08-31 NOTE — Care Management Note (Signed)
Case Management Note  CM consulted for DME O2 by Dr. Fayrene FearingJames.  Pt is requesting to leave the ED today with SpO2 70s RA and [facility] but the [facility] was picked up several months ago.  Spoke with Clydie BraunKaren with [facility] who advised that insurance will not authorize O2 DME from the ED, pt will need to get the orders from her PCP or be admitted, even if pt previously was on home O2.  Updated Dr. Fayrene FearingJames.  No further CM needs noted.

## 2017-08-31 NOTE — Discharge Instructions (Signed)
Dr. Fayrene FearingJames has recommended that you stay in the hospital for treatment for your COPD exacerbation. If you become worse at home, please return for reevaluation. Continue oxygen at 3 L if able to arrange this through [facility]. Prednisone 20 mg twice per day for the next 5 days. Albuterol every 4 hours as needed. Follow-up with your pulmonary tomorrow.

## 2017-08-31 NOTE — ED Notes (Signed)
MD made aware of pt.

## 2017-08-31 NOTE — ED Notes (Signed)
This RN rounded on the patient, pt also tells me that she is ready to go. "I am hungry and I am ready to go, it's got to by yalls way or no way." This RN explained that we were making strides into getting the home oxygen she needs. Numerous phone calls have been made to her pulmonologist and now her PCP. Pt was also explained the risks of leaving AMA versus getting admitted. Pt still wishes to leave AMA but refuses to sign for it. She states "I don't want that ama on my record." Pts IV was removed as she is leaving the facility.

## 2017-08-31 NOTE — ED Provider Notes (Signed)
[facility] EMERGENCY DEPARTMENT Provider Note   CSN: [id number] Arrival date & time: 08/31/17  2015     History   Chief Complaint Chief Complaint  Patient presents with  . Shortness of Breath    HPI Kaitlyn Weber is a 57 y.o. female.  The history is provided by the patient. No language interpreter was used.   Kaitlyn Weber is a 57 y.o. female who presents to the Emergency Department complaining of sob. Patient returns to the emergency department to receive oxygen therapy. She left against medical advice earlier today due to hypoxia with COPD exacerbation. The recommendation was admission to the hospital for hypoxemia and COPD exacerbation. She returned to the emergency department because she was told that supplemental oxygen will be available for her at home.  No new events since recent ED eval. She denies any fevers, chest pain, leg swelling or pain. Past Medical History:  Diagnosis Date  . CHF (congestive heart failure) (HCC)   . Diabetes mellitus without complication (HCC)   . Heart failure (HCC)   . Hyperlipidemia   . Hypertension     There are no active problems to display for this patient.   Past Surgical History:  Procedure Laterality Date  . 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 CESAREAN SECTION       OB History   None      Home Medications    Prior to Admission medications   Medication Sig Start Date End Date Taking? Authorizing Provider  albuterol (ACCUNEB) 1.25 MG/3ML nebulizer solution Take 1 ampule by nebulization every 6 (six) hours as needed for wheezing.    [provider]  albuterol (PROVENTIL HFA;VENTOLIN HFA) 108 (90 Base) MCG/ACT inhaler Inhale into the lungs every 6 (six) hours as needed for wheezing or shortness of breath.    [provider]  azithromycin (ZITHROMAX) 250 MG tablet Take 1 tablet (250 mg total) by mouth daily. Take first 2 tablets together, then 1 every day  until finished. 07/05/17   Pricilla LovelessGoldston, Scott, MD  furosemide (LASIX) 20 MG tablet Take 20 mg by mouth.    [provider]  lisinopril (PRINIVIL,ZESTRIL) 20 MG tablet Take 20 mg by mouth daily.    [provider]  metFORMIN (GLUCOPHAGE) 500 MG tablet Take by mouth 2 (two) times daily with a meal.    [provider]  predniSONE (DELTASONE) 20 MG tablet Take 1 tablet (20 mg total) by mouth 2 (two) times daily with a meal. 08/31/17   Tilden Fossaees, Janera Peugh, MD  tiotropium (SPIRIVA) 18 MCG inhalation capsule Place 18 mcg into inhaler and inhale daily.    [provider]    Family History No family history on file.  Social History Social History   Tobacco Use  . Smoking status: Former Games developermoker  . Smokeless tobacco: Never Used  Substance Use Topics  . Alcohol use: No  . Drug use: No     Allergies   Sulfa antibiotics   Review of Systems Review of Systems  All other systems reviewed and are negative.    Physical Exam Updated Vital Signs BP 119/78 (BP Location: Left Arm)   Pulse (!) 106   Temp 98.4 F (36.9 C) (Oral)   Resp 20   Ht 5\' 5"  (1.651 m)   Wt 70.3 kg (155 lb)   SpO2 90%   BMI 25.79 kg/m   Physical Exam  Constitutional: She is oriented to person, place, and  time. She appears well-developed and well-nourished.  HENT:  Head: Normocephalic and atraumatic.  Cardiovascular: Regular rhythm.  No murmur heard. Tachycardic  Pulmonary/Chest: Effort normal. No respiratory distress.  Mild lead to. Good air movement bilaterally with faint end expiratory wheezes  Abdominal: Soft. There is no tenderness. There is no rebound and no guarding.  Musculoskeletal: She exhibits no edema or tenderness.  Neurological: She is alert and oriented to person, place, and time.  Skin: Skin is warm and dry.  Psychiatric: She has a normal mood and affect. Her behavior is normal.  Nursing note and vitals reviewed.    ED Treatments / Results  Labs (all labs  ordered are listed, but only abnormal results are displayed) Labs Reviewed - No data to display  EKG None  Radiology Dg Chest 2 View  Result Date: 08/31/2017 CLINICAL DATA:  Cough and shortness of breath for 1 week. EXAM: CHEST - 2 VIEW COMPARISON:  07/05/2017. FINDINGS: Trachea is midline. Heart is mildly enlarged. Lungs are clear. No pleural fluid. IMPRESSION: No acute findings. Electronically Signed   By: Leanna Battles M.D.   On: 08/31/2017 14:37    Procedures Procedures (including critical care time)  Medications Ordered in ED Medications - No data to display   Initial Impression / Assessment and Plan / ED Course  I have reviewed the triage vital signs and the nursing notes.  Pertinent labs & imaging results that were available during my care of the patient were reviewed by me and considered in my medical decision making (see chart for details).     Patient with history of COPD, new oxygen requirement diagnosed earlier today returns to the emergency department to receive supplemental oxygen for home therapy. Discussed with patient that admission to the hospital is recommended but she declines. [facility] was able to arrange supplemental oxygen for her to take home. Recommend 2 to 3 L four shortness of breath. She does have pulmonology follow-up tomorrow. She continues to smoke. Current presentation is not consistent with ACS, CHF, pneumonia, PE. Discussed with patient home care, outpatient follow-up and return precautions.  Final Clinical Impressions(s) / ED Diagnoses   Final diagnoses:  Hypoxia    ED Discharge Orders        Ordered    predniSONE (DELTASONE) 20 MG tablet  2 times daily with meals     08/31/17 2251       Tilden Fossa, MD 08/31/17 2329

## 2017-08-31 NOTE — ED Triage Notes (Signed)
Pt was seen here earlier. Sent home with rx for O2. Pt states she was told to come back until O2 can be delivered.

## 2017-08-31 NOTE — ED Provider Notes (Addendum)
[facility] EMERGENCY DEPARTMENT Provider Note   CSN: [id number] Arrival date & time: 08/31/17  1230     History   Chief Complaint Chief Complaint  Patient presents with  . Shortness of Breath    HPI Kaitlyn Weber is a 57 y.o. female.  Chief complaint is difficulty breathing, history of asthma.  HPI 57 year old female.  History of CHF, diabetes, reactive airways disease/asthma.  Describe difficulty breathing over the last several days.  Wheezing, using albuterol at home with minimal improvement.  States she has felt worse yesterday than today but her breathing was still difficult today and presents here.  Arrives with saturations 70%.  Audibly wheezing.  Story of tobacco use.  Current non-smoker.  Past Medical History:  Diagnosis Date  . CHF (congestive heart failure) (HCC)   . Diabetes mellitus without complication (HCC)   . Heart failure (HCC)   . Hyperlipidemia   . Hypertension     There are no active problems to display for this patient.   Past Surgical History:  Procedure Laterality Date  . 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 CESAREAN SECTION       OB History   None      Home Medications    Prior to Admission medications   Medication Sig Start Date End Date Taking? Authorizing Provider  albuterol (ACCUNEB) 1.25 MG/3ML nebulizer solution Take 1 ampule by nebulization every 6 (six) hours as needed for wheezing.   Yes [provider]  albuterol (PROVENTIL HFA;VENTOLIN HFA) 108 (90 Base) MCG/ACT inhaler Inhale into the lungs every 6 (six) hours as needed for wheezing or shortness of breath.   Yes [provider]  tiotropium (SPIRIVA) 18 MCG inhalation capsule Place 18 mcg into inhaler and inhale daily.   Yes [provider]  azithromycin (ZITHROMAX) 250 MG tablet Take 1 tablet (250 mg total) by mouth daily. Take first 2 tablets together, then 1 every day until finished. 07/05/17    Pricilla LovelessGoldston, Scott, MD  furosemide (LASIX) 20 MG tablet Take 20 mg by mouth.    [provider]  lisinopril (PRINIVIL,ZESTRIL) 20 MG tablet Take 20 mg by mouth daily.    [provider]  metFORMIN (GLUCOPHAGE) 500 MG tablet Take by mouth 2 (two) times daily with a meal.    [provider]  predniSONE (DELTASONE) 20 MG tablet 2 tabs po daily x 4 days 07/06/17   Pricilla LovelessGoldston, Scott, MD    Family History History reviewed. No pertinent family history.  Social History Social History   Tobacco Use  . Smoking status: Former Games developermoker  . Smokeless tobacco: Never Used  Substance Use Topics  . Alcohol use: No  . Drug use: No     Allergies   Sulfa antibiotics   Review of Systems Review of Systems  Constitutional: Negative for appetite change, chills, diaphoresis, fatigue and fever.  HENT: Negative for mouth sores, sore throat and trouble swallowing.   Eyes: Negative for visual disturbance.  Respiratory: Positive for cough, shortness of breath and wheezing. Negative for chest tightness.   Cardiovascular: Negative for chest pain.  Gastrointestinal: Negative for abdominal distention, abdominal pain, diarrhea, nausea and vomiting.  Endocrine: Negative for polydipsia, polyphagia and polyuria.  Genitourinary: Negative for dysuria, frequency and hematuria.  Musculoskeletal: Negative for gait problem.  Skin: Negative for color change, pallor and rash.  Neurological: Negative for dizziness, syncope, light-headedness and headaches.  Hematological: Does not bruise/bleed easily.  Psychiatric/Behavioral: Negative  for behavioral problems and confusion.     Physical Exam Updated Vital Signs BP 138/86   Pulse (!) 106   Temp 98.6 F (37 C)   Resp 17   Ht 5\' 5"  (1.651 m)   Wt 70.3 kg (155 lb)   SpO2 96%   BMI 25.79 kg/m   Physical Exam  Constitutional: She is oriented to person, place, and time. She appears well-developed and well-nourished. No distress.  HENT:  Head:  Normocephalic.  Eyes: Pupils are equal, round, and reactive to light. Conjunctivae are normal. No scleral icterus.  Neck: Normal range of motion. Neck supple. No thyromegaly present.  Cardiovascular: Normal rate and regular rhythm. Exam reveals no gallop and no friction rub.  No murmur heard. Pulmonary/Chest: No respiratory distress. She has no wheezes. She has no rales.  Tachypnea.  Wheezing in all fields.  Prolongation.  No asymmetry or focal diminished breath sounds.  Abdominal: Soft. Bowel sounds are normal. She exhibits no distension. There is no tenderness. There is no rebound.  Musculoskeletal: Normal range of motion.  Neurological: She is alert and oriented to person, place, and time.  Skin: Skin is warm and dry. No rash noted.  Psychiatric: She has a normal mood and affect. Her behavior is normal.     ED Treatments / Results  Labs (all labs ordered are listed, but only abnormal results are displayed) Labs Reviewed  CBC WITH DIFFERENTIAL/PLATELET - Abnormal; Notable for the following components:      Result Value   WBC 3.7 (*)    RBC 5.91 (*)    Hemoglobin 17.8 (*)    HCT 56.0 (*)    All other components within normal limits  BRAIN NATRIURETIC PEPTIDE - Abnormal; Notable for the following components:   B Natriuretic Peptide 239.7 (*)    All other components within normal limits  COMPREHENSIVE METABOLIC PANEL - Abnormal; Notable for the following components:   Sodium 132 (*)    Chloride 92 (*)    Glucose, Bld 210 (*)    Calcium 8.5 (*)    Total Protein 6.3 (*)    All other components within normal limits    EKG None  Radiology Dg Chest 2 View  Result Date: 08/31/2017 CLINICAL DATA:  Cough and shortness of breath for 1 week. EXAM: CHEST - 2 VIEW COMPARISON:  07/05/2017. FINDINGS: Trachea is midline. Heart is mildly enlarged. Lungs are clear. No pleural fluid. IMPRESSION: No acute findings. Electronically Signed   By: Leanna Battles M.D.   On: 08/31/2017 14:37     Procedures Procedures (including critical care time)  Medications Ordered in ED Medications  albuterol (PROVENTIL,VENTOLIN) solution continuous neb (10 mg/hr Nebulization Given 08/31/17 1253)  methylPREDNISolone sodium succinate (SOLU-MEDROL) 125 mg/2 mL injection 125 mg (125 mg Intravenous Given 08/31/17 1312)     Initial Impression / Assessment and Plan / ED Course  I have reviewed the triage vital signs and the nursing notes.  Pertinent labs & imaging results that were available during my care of the patient were reviewed by me and considered in my medical decision making (see chart for details).    EKG Interpretation Sinus rhythm.  Not tachycardic.  No acute or ischemic changes.  Slow R wave progression.  No change.  Time 1440 3 PM.   Reevaluation/medical decision making.  Chest x-ray shows no CHF or fluid.  Minimal elevation of BNP is 239.  She is finished nebulized albuterol treatment.  Feeling considerably better.  Still has end expiratory wheezing.  Will evaluate on room air to see if her hypoxia has resolved.  May require admission if not.  Labs elevation of hemoglobin and hematocrit consistent with probable chronic hypoxemia.  1504 p.m. patient 70% on room air.  I recommended admission.  She politely declines.  She wants to go home on her oxygen.  I called our care manager who [facility] with [facility].  This cannot be reinitiated in the emergency room.  She is on the phone with her pulmonary physician attempting to have them send an order for home oxygen.  Final Clinical Impressions(s) / ED Diagnoses   Final diagnoses:  COPD exacerbation [facility])  Hypoxia    ED Discharge Orders    None       Rolland Porter, MD 08/31/17 1444    Rolland Porter, MD 08/31/17 1504

## 2017-08-31 NOTE — ED Triage Notes (Signed)
Pt c/o SOB x 1 week , HX COPD , CHF

## 2017-08-31 NOTE — ED Notes (Signed)
Dr. Madilyn Hookees with patient working on getting the patient home oxygen. Pt continues to require 2.3-3L.

## 2019-09-28 ENCOUNTER — Encounter (INDEPENDENT_AMBULATORY_CARE_PROVIDER_SITE_OTHER): Payer: Medicare Other | Admitting: Ophthalmology

## 2019-12-18 IMAGING — CR DG CHEST 2V
2 series · 2 of 2 positions shown · non-contrast
Comparison: 07/05/2017.

CLINICAL DATA: Cough and shortness of breath for 1 week.

EXAM:
CHEST - 2 VIEW

[w chest pa]
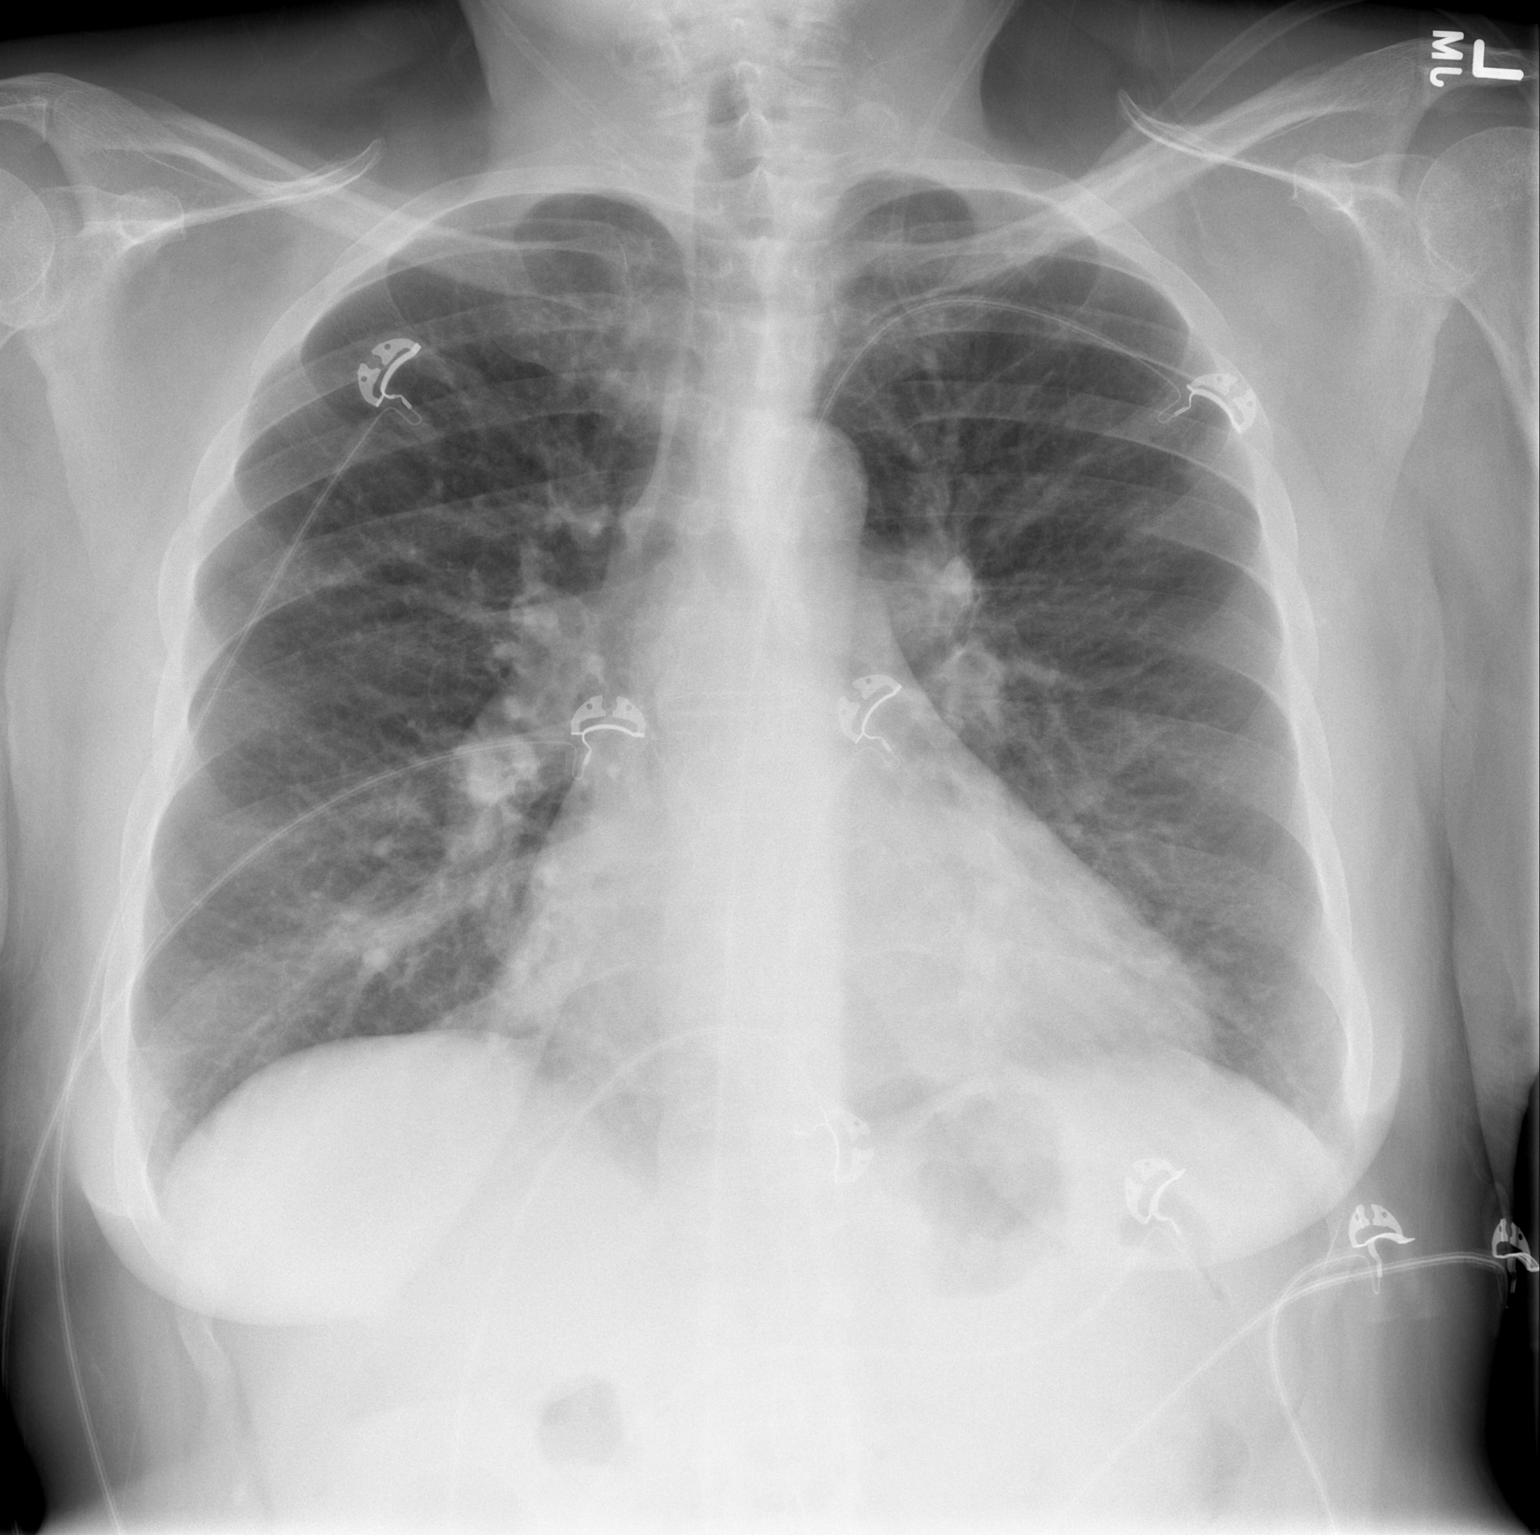

[w chest lat]
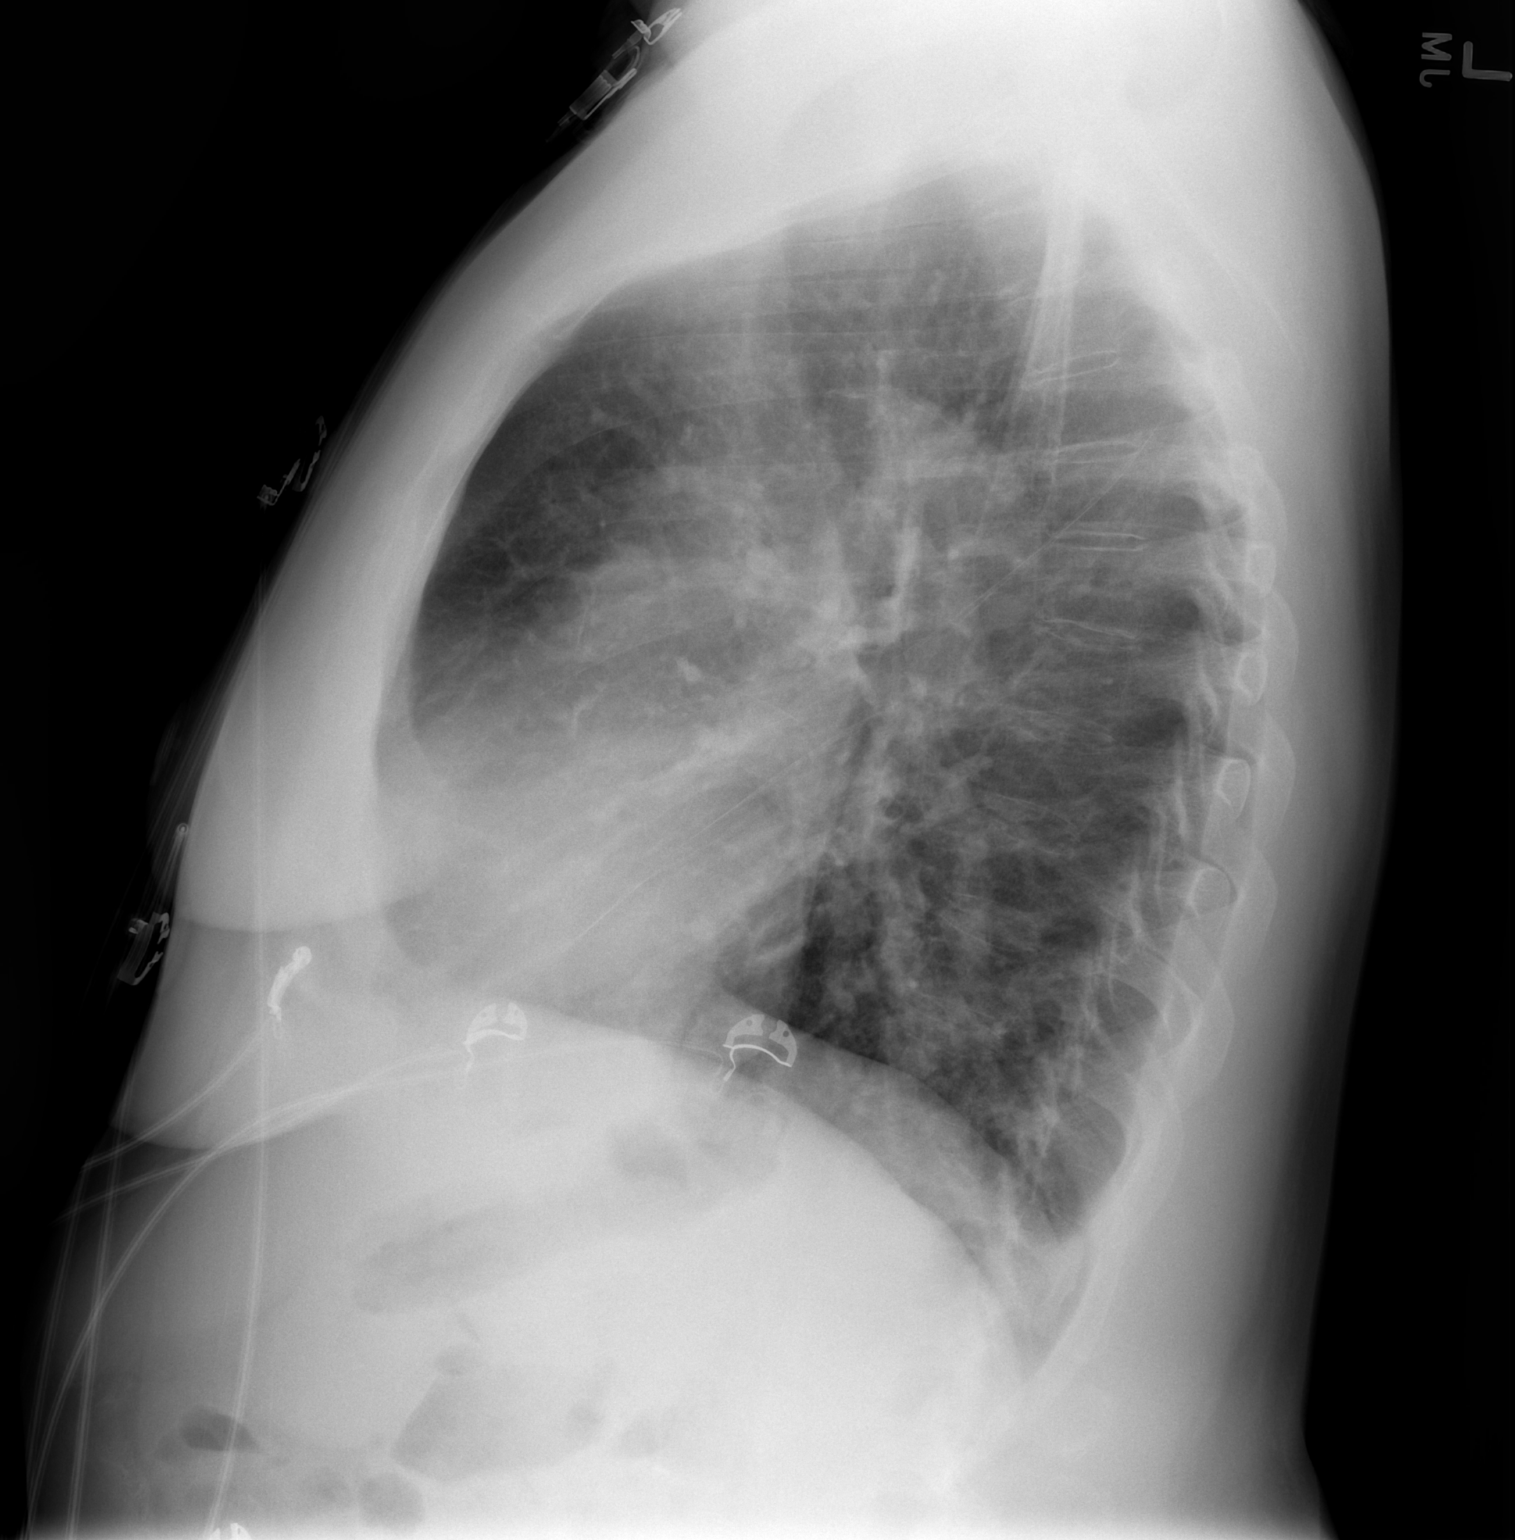

[2 of 2 positions shown; findings below may reference images not displayed]

FINDINGS: Trachea is midline. Heart is mildly enlarged. Lungs are clear. No
pleural fluid.
IMPRESSION: No acute findings.

## 2020-01-01 ENCOUNTER — Telehealth: Payer: Self-pay | Admitting: Physician Assistant

## 2020-01-01 NOTE — Telephone Encounter (Signed)
I connected by phone with Denyse Amass and/or patient's caregiver on 01/01/2020 at 4:58 PM to discuss the potential vaccination through our Homebound vaccination initiative.   Prevaccination Checklist for COVID-19 Vaccines  1.  Are you feeling sick today? no  2.  Have you ever received a dose of a COVID-19 vaccine?  no      If yes, which one? None   3.  Have you ever had an allergic reaction: (This would include a severe reaction [ e.g., anaphylaxis] that required treatment with epinephrine or EpiPen or that caused you to go to the hospital.  It would also include an allergic reaction that occurred within 4 hours that caused hives, swelling, or respiratory distress, including wheezing.) A.  A previous dose of COVID-19 vaccine. no  B.  A vaccine or injectable therapy that contains multiple components, one of which is a COVID-19 vaccine component, but it is not known which component elicited the immediate reaction. no  C.  Are you allergic to polyethylene glycol? no  D. Are you allergic to Polysorbate, which is found in some vaccines, film coated tablets and intravenous steroids?  no   4.  Have you ever had an allergic reaction to another vaccine (other than COVID-19 vaccine) or an injectable medication? (This would include a severe reaction [ e.g., anaphylaxis] that required treatment with epinephrine or EpiPen or that caused you to go to the hospital.  It would also include an allergic reaction that occurred within 4 hours that caused hives, swelling, or respiratory distress, including wheezing.)  no   5.  Have you ever had a severe allergic reaction (e.g., anaphylaxis) to something other than a component of the COVID-19 vaccine, or any vaccine or injectable medication?  This would include food, pet, venom, environmental, or oral medication allergies.  no   6.  Have you received any vaccine in the last 14 days? no   7.  Have you ever had a positive test for COVID-19 or has a doctor ever told you  that you had COVID-19?  no   8.  Have you received passive antibody therapy (monoclonal antibodies or convalescent serum) as a treatment for COVID-19? no   9.  Do you have a weakened immune system caused by something such as HIV infection or cancer or do you take immunosuppressive drugs or therapies?  no   10.  Do you have a bleeding disorder or are you taking a blood thinner? no   11.  Are you pregnant or breast-feeding? no   12.  Do you have dermal fillers? no   __________________   This patient is a 59 y.o. female that meets the FDA criteria to receive homebound vaccination. Patient or parent/caregiver understands they have the option to accept or refuse homebound vaccination.  Patient passed the pre-screening checklist and would like to proceed with homebound vaccination.  Based on questionnaire above, I recommend the patient be observed for 15 minutes.  There are no other household members/caregivers who are also interested in receiving the vaccine.   I will send the patient's information to our scheduling team who will reach out to schedule the patient and potential caregiver/family members for homebound vaccination.   Cline Crock 01/01/2020 4:58 PM

## 2020-01-01 NOTE — Telephone Encounter (Signed)
Called to discuss the homebound Covid-19 vaccination initiative with the patient and/or caregiver.   Message left to call back.  Parthena Fergeson PA-C  [facility]     

## 2020-01-10 ENCOUNTER — Ambulatory Visit: Payer: Medicare Other | Attending: Critical Care Medicine

## 2020-01-10 DIAGNOSIS — Z23 Encounter for immunization: Secondary | ICD-10-CM

## 2020-01-10 NOTE — Progress Notes (Signed)
   Covid-19 Vaccination Clinic  Name:  Kaitlyn Weber    MRN: [id number] DOB: 14-Apr-1961  01/10/2020  [name] was observed post Covid-19 immunization for 15 minutes without incident. She was provided with Vaccine Information Sheet and instruction to access the V-Safe system.   [name] was instructed to call 911 with any severe reactions post vaccine: Marland Kitchen Difficulty breathing  . Swelling of face and throat  . A fast heartbeat  . A bad rash all over body  . Dizziness and weakness   Immunizations Administered    Name Date Dose VIS Date Route   Moderna COVID-19 Vaccine 01/10/2020 11:18 AM 0.5 mL 04/2019 Intramuscular   Manufacturer: Moderna   Lot: [id number]   NDC: 16244-695-07

## 2020-02-07 ENCOUNTER — Ambulatory Visit: Payer: Medicare Other

## 2020-02-22 ENCOUNTER — Other Ambulatory Visit: Payer: Self-pay

## 2020-02-22 ENCOUNTER — Ambulatory Visit: Payer: Medicare Other | Attending: Internal Medicine

## 2020-02-22 DIAGNOSIS — Z23 Encounter for immunization: Secondary | ICD-10-CM

## 2020-02-22 NOTE — Progress Notes (Signed)
   Covid-19 Vaccination Clinic  Name:  Kaitlyn Weber    MRN: [id number] DOB: 12-31-1960  02/22/2020  Kaitlyn Weber was observed post Covid-19 immunization for 15 minutes without incident. She was provided with Vaccine Information Sheet and instruction to access the V-Safe system.   Kaitlyn Weber was instructed to call 911 with any severe reactions post vaccine: Marland Kitchen Difficulty breathing  . Swelling of face and throat  . A fast heartbeat  . A bad rash all over body  . Dizziness and weakness   Immunizations Administered    Name Date Dose VIS Date Route   Moderna COVID-19 Vaccine 02/22/2020 10:50 AM 0.5 mL 04/2019 Intramuscular   Manufacturer: Moderna   Lot: [id number]   NDC: 59292-446-28

## 2020-07-30 ENCOUNTER — Telehealth (HOSPITAL_COMMUNITY): Payer: Self-pay

## 2020-07-30 NOTE — Telephone Encounter (Signed)
Pt insurance is active and benefits verified through [facility] [employer]. Co-pay $20.00, DED $0.00/$0.00 met, out of pocket $4,500.00/$104.57 met, co-insurance 0%. No pre-authorization required. Jackie/[employer] Medicare, 07/30/20 @ 1042AM, IYU#[id number]  Will contact patient to see if she is interested in the Pulmonary Rehab Program.

## 2020-08-01 ENCOUNTER — Encounter (HOSPITAL_COMMUNITY): Payer: Self-pay | Admitting: *Deleted

## 2020-08-01 NOTE — Progress Notes (Signed)
Received fax from [facility] regarding this pt participating in cardiac rehab with the diagnosis of diastolic heart failure.  Unfortunately this is not a covered diagnosis for medicare plans to reimburse for cardiac rehab.  Pt most recent EF shows normal EF.  [facility] cardiac rehab staff talked with pt regarding pulmonary rehab.  Pt is interested however [facility] has not resumed their pulmonary rehab program therefore her information was faxed to [facility].  Reviewed in care everywhere her medical history from both cardiac and pulmonary.  Will need updated referral specifically for pulmonary rehab. Will contact Dr. Senaida Ores office for request. Once this is received pt will be contacted for scheduling.  Pt insurance benefits have been determined. Alanson Aly, BSN Cardiac and Emergency planning/management officer

## 2020-08-21 ENCOUNTER — Ambulatory Visit: Payer: Medicare Other

## 2020-09-18 ENCOUNTER — Ambulatory Visit: Payer: Medicare Other

## 2020-09-20 ENCOUNTER — Ambulatory Visit: Payer: Medicare Other

## 2020-09-26 ENCOUNTER — Telehealth (HOSPITAL_COMMUNITY): Payer: Self-pay

## 2020-09-26 ENCOUNTER — Encounter (HOSPITAL_COMMUNITY): Payer: Self-pay

## 2020-09-26 NOTE — Telephone Encounter (Signed)
Attempted to call patient in regards to Pulmonary Rehab - LM on VM Mailed letter 

## 2020-10-01 ENCOUNTER — Telehealth (HOSPITAL_COMMUNITY): Payer: Self-pay

## 2020-10-01 NOTE — Telephone Encounter (Signed)
Pt called and stated she is interested in PR, went over insurance and where we are with scheduling.

## 2020-10-02 ENCOUNTER — Ambulatory Visit: Payer: Medicare Other

## 2020-10-05 ENCOUNTER — Encounter (HOSPITAL_COMMUNITY): Payer: Self-pay

## 2020-10-05 ENCOUNTER — Telehealth (HOSPITAL_COMMUNITY): Payer: Self-pay

## 2020-10-05 NOTE — Telephone Encounter (Signed)
Attempted to call patient in regards to Pulmonary Rehab - LM on VM Mailed letter 

## 2020-10-09 ENCOUNTER — Telehealth (HOSPITAL_COMMUNITY): Payer: Self-pay

## 2020-10-09 NOTE — Telephone Encounter (Signed)
Pt called wanting to change her pulmonary rehab session dates. Pt will come in for pulmonary rehab orientation on 11/09/2020@1 :30pm and will attend the 1:15pm session. Mailed packet.

## 2020-10-29 ENCOUNTER — Encounter (HOSPITAL_COMMUNITY): Payer: Medicare Other

## 2020-10-31 ENCOUNTER — Other Ambulatory Visit: Payer: Self-pay

## 2020-10-31 ENCOUNTER — Ambulatory Visit: Payer: Medicare Other

## 2020-11-06 ENCOUNTER — Ambulatory Visit (HOSPITAL_COMMUNITY): Payer: Medicare Other

## 2020-11-08 ENCOUNTER — Telehealth (HOSPITAL_COMMUNITY): Payer: Self-pay | Admitting: *Deleted

## 2020-11-08 ENCOUNTER — Ambulatory Visit (HOSPITAL_COMMUNITY): Payer: Medicare Other

## 2020-11-08 NOTE — Telephone Encounter (Signed)
Called pt to confirm her PR orientation appointment. Pt states that she has an appointment to get her CPAP fitting at 2 pm on the same day. Pt states that her physician has told her getting the CPAP started is extremely important. I offered pt a couple of options but both of those were morning appointments and pt states that she is not capable of getting started early in the morning due to her SOB and  her physical condition.She states that she can not do anything that requires her to get out of the house in the morning hours. I explained to her that we would have to reschedule her and that we have a 1-2 month waiting list. Pt also states that they have purchased a home in [place] and the closing date is at the end of July. She is wondering if they have a pulmonary program there.I explained that we would contact her and discuss things with her at that time. She was very appreciative of our help.

## 2020-11-09 ENCOUNTER — Ambulatory Visit (HOSPITAL_COMMUNITY): Payer: Medicare Other

## 2020-11-13 ENCOUNTER — Ambulatory Visit (HOSPITAL_COMMUNITY): Payer: Medicare Other

## 2020-11-14 ENCOUNTER — Ambulatory Visit: Payer: Medicare Other

## 2020-11-15 ENCOUNTER — Ambulatory Visit (HOSPITAL_COMMUNITY): Payer: Medicare Other

## 2020-11-20 ENCOUNTER — Ambulatory Visit (HOSPITAL_COMMUNITY): Payer: Medicare Other

## 2020-11-22 ENCOUNTER — Ambulatory Visit (HOSPITAL_COMMUNITY): Payer: Medicare Other

## 2020-11-27 ENCOUNTER — Ambulatory Visit (HOSPITAL_COMMUNITY): Payer: Medicare Other

## 2020-11-29 ENCOUNTER — Ambulatory Visit (HOSPITAL_COMMUNITY): Payer: Medicare Other

## 2020-12-04 ENCOUNTER — Ambulatory Visit (HOSPITAL_COMMUNITY): Payer: Medicare Other

## 2020-12-06 ENCOUNTER — Ambulatory Visit (HOSPITAL_COMMUNITY): Payer: Medicare Other

## 2020-12-11 ENCOUNTER — Ambulatory Visit (HOSPITAL_COMMUNITY): Payer: Medicare Other

## 2020-12-13 ENCOUNTER — Ambulatory Visit (HOSPITAL_COMMUNITY): Payer: Medicare Other

## 2020-12-18 ENCOUNTER — Ambulatory Visit (HOSPITAL_COMMUNITY): Payer: Medicare Other

## 2020-12-20 ENCOUNTER — Ambulatory Visit (HOSPITAL_COMMUNITY): Payer: Medicare Other

## 2020-12-25 ENCOUNTER — Ambulatory Visit (HOSPITAL_COMMUNITY): Payer: Medicare Other

## 2020-12-27 ENCOUNTER — Ambulatory Visit (HOSPITAL_COMMUNITY): Payer: Medicare Other

## 2021-01-01 ENCOUNTER — Ambulatory Visit (HOSPITAL_COMMUNITY): Payer: Medicare Other

## 2021-01-03 ENCOUNTER — Ambulatory Visit (HOSPITAL_COMMUNITY): Payer: Medicare Other

## 2021-01-08 ENCOUNTER — Ambulatory Visit (HOSPITAL_COMMUNITY): Payer: Medicare Other

## 2021-01-10 ENCOUNTER — Ambulatory Visit (HOSPITAL_COMMUNITY): Payer: Medicare Other
